# Patient Record
Sex: Male | Born: 1970 | Race: Black or African American | Hispanic: No | Marital: Single | Smoking: Never smoker
Health system: Southern US, Community
[De-identification: ages and names within clinical notes are randomized; demographics above are authoritative.]

---

## 1998-09-12 ENCOUNTER — Emergency Department (HOSPITAL_COMMUNITY): Admission: EM | Admit: 1998-09-12 | Discharge: 1998-09-12 | Payer: Self-pay | Admitting: Emergency Medicine

## 2010-12-31 ENCOUNTER — Other Ambulatory Visit: Payer: Self-pay | Admitting: Otolaryngology

## 2010-12-31 DIAGNOSIS — H9201 Otalgia, right ear: Secondary | ICD-10-CM

## 2011-01-09 ENCOUNTER — Ambulatory Visit
Admission: RE | Admit: 2011-01-09 | Discharge: 2011-01-09 | Disposition: A | Discharge status: Home / Self Care | Payer: Medicaid Other | Source: Ambulatory Visit | Attending: Otolaryngology | Admitting: Otolaryngology

## 2011-01-09 DIAGNOSIS — H9201 Otalgia, right ear: Secondary | ICD-10-CM

## 2011-01-09 MED ORDER — GADOBENATE DIMEGLUMINE 529 MG/ML IV SOLN
20.0000 mL | Freq: Once | INTRAVENOUS | Status: AC | PRN
  Administered 2011-01-09: 20 mL via INTRAVENOUS

## 2011-03-17 ENCOUNTER — Ambulatory Visit
Admission: RE | Admit: 2011-03-17 | Discharge: 2011-03-17 | Disposition: A | Discharge status: Home / Self Care | Payer: Medicaid Other | Source: Ambulatory Visit | Attending: Physician Assistant | Admitting: Physician Assistant

## 2011-03-17 ENCOUNTER — Other Ambulatory Visit: Payer: Self-pay | Admitting: Physician Assistant

## 2011-03-17 DIAGNOSIS — T1490XA Injury, unspecified, initial encounter: Secondary | ICD-10-CM

## 2011-03-25 ENCOUNTER — Ambulatory Visit: Payer: Medicaid Other | Attending: Orthopedic Surgery | Admitting: Occupational Therapy

## 2011-03-25 DIAGNOSIS — M25549 Pain in joints of unspecified hand: Secondary | ICD-10-CM | POA: Insufficient documentation

## 2011-03-25 DIAGNOSIS — M6281 Muscle weakness (generalized): Secondary | ICD-10-CM | POA: Insufficient documentation

## 2011-03-25 DIAGNOSIS — M25649 Stiffness of unspecified hand, not elsewhere classified: Secondary | ICD-10-CM | POA: Insufficient documentation

## 2011-03-25 DIAGNOSIS — IMO0001 Reserved for inherently not codable concepts without codable children: Secondary | ICD-10-CM | POA: Insufficient documentation

## 2011-03-31 ENCOUNTER — Encounter: Payer: Medicaid Other | Admitting: Occupational Therapy

## 2011-04-09 ENCOUNTER — Ambulatory Visit: Payer: Medicaid Other | Admitting: Occupational Therapy

## 2011-04-17 ENCOUNTER — Encounter: Payer: Medicaid Other | Admitting: Occupational Therapy

## 2011-05-04 ENCOUNTER — Ambulatory Visit: Payer: Medicaid Other | Attending: Orthopedic Surgery | Admitting: Occupational Therapy

## 2011-05-04 DIAGNOSIS — M6281 Muscle weakness (generalized): Secondary | ICD-10-CM | POA: Insufficient documentation

## 2011-05-04 DIAGNOSIS — M25649 Stiffness of unspecified hand, not elsewhere classified: Secondary | ICD-10-CM | POA: Insufficient documentation

## 2011-05-04 DIAGNOSIS — M25549 Pain in joints of unspecified hand: Secondary | ICD-10-CM | POA: Insufficient documentation

## 2011-05-04 DIAGNOSIS — IMO0001 Reserved for inherently not codable concepts without codable children: Secondary | ICD-10-CM | POA: Insufficient documentation

## 2011-07-13 ENCOUNTER — Ambulatory Visit: Payer: Medicaid Other | Attending: Orthopedic Surgery | Admitting: Occupational Therapy

## 2011-07-13 DIAGNOSIS — M25649 Stiffness of unspecified hand, not elsewhere classified: Secondary | ICD-10-CM | POA: Insufficient documentation

## 2011-07-13 DIAGNOSIS — M6281 Muscle weakness (generalized): Secondary | ICD-10-CM | POA: Insufficient documentation

## 2011-07-13 DIAGNOSIS — M25549 Pain in joints of unspecified hand: Secondary | ICD-10-CM | POA: Insufficient documentation

## 2011-07-13 DIAGNOSIS — IMO0001 Reserved for inherently not codable concepts without codable children: Secondary | ICD-10-CM | POA: Insufficient documentation

## 2011-07-28 ENCOUNTER — Encounter: Payer: Medicaid Other | Admitting: Occupational Therapy

## 2011-08-04 ENCOUNTER — Encounter: Payer: Medicaid Other | Admitting: Occupational Therapy

## 2011-08-11 ENCOUNTER — Encounter: Payer: Medicaid Other | Admitting: Occupational Therapy

## 2011-08-14 ENCOUNTER — Ambulatory Visit: Payer: Medicaid Other | Attending: Orthopedic Surgery | Admitting: Occupational Therapy

## 2011-08-14 DIAGNOSIS — M25549 Pain in joints of unspecified hand: Secondary | ICD-10-CM | POA: Insufficient documentation

## 2011-08-14 DIAGNOSIS — IMO0001 Reserved for inherently not codable concepts without codable children: Secondary | ICD-10-CM | POA: Insufficient documentation

## 2011-08-14 DIAGNOSIS — M25649 Stiffness of unspecified hand, not elsewhere classified: Secondary | ICD-10-CM | POA: Insufficient documentation

## 2011-08-18 ENCOUNTER — Ambulatory Visit: Payer: Medicaid Other | Admitting: Occupational Therapy

## 2011-09-01 ENCOUNTER — Encounter: Payer: Medicaid Other | Admitting: Occupational Therapy

## 2014-05-08 ENCOUNTER — Other Ambulatory Visit (INDEPENDENT_AMBULATORY_CARE_PROVIDER_SITE_OTHER): Payer: Self-pay | Admitting: Surgery

## 2015-08-15 ENCOUNTER — Other Ambulatory Visit: Payer: Self-pay | Admitting: Internal Medicine

## 2016-12-04 ENCOUNTER — Emergency Department (HOSPITAL_BASED_OUTPATIENT_CLINIC_OR_DEPARTMENT_OTHER): Payer: Worker's Compensation

## 2016-12-04 ENCOUNTER — Encounter (HOSPITAL_BASED_OUTPATIENT_CLINIC_OR_DEPARTMENT_OTHER): Payer: Self-pay | Admitting: *Deleted

## 2016-12-04 ENCOUNTER — Emergency Department (HOSPITAL_BASED_OUTPATIENT_CLINIC_OR_DEPARTMENT_OTHER)
Admission: EM | Admit: 2016-12-04 | Discharge: 2016-12-04 | Disposition: A | Discharge status: Home / Self Care | Payer: Worker's Compensation | Attending: Emergency Medicine | Admitting: Emergency Medicine

## 2016-12-04 DIAGNOSIS — Y929 Unspecified place or not applicable: Secondary | ICD-10-CM | POA: Diagnosis not present

## 2016-12-04 DIAGNOSIS — Y990 Civilian activity done for income or pay: Secondary | ICD-10-CM | POA: Insufficient documentation

## 2016-12-04 DIAGNOSIS — S81822A Laceration with foreign body, left lower leg, initial encounter: Secondary | ICD-10-CM | POA: Diagnosis not present

## 2016-12-04 DIAGNOSIS — S8992XA Unspecified injury of left lower leg, initial encounter: Secondary | ICD-10-CM | POA: Diagnosis present

## 2016-12-04 DIAGNOSIS — Y939 Activity, unspecified: Secondary | ICD-10-CM | POA: Insufficient documentation

## 2016-12-04 DIAGNOSIS — W458XXA Other foreign body or object entering through skin, initial encounter: Secondary | ICD-10-CM | POA: Diagnosis not present

## 2016-12-04 DIAGNOSIS — S80852A Superficial foreign body, left lower leg, initial encounter: Secondary | ICD-10-CM

## 2016-12-04 MED ORDER — AMOXICILLIN-POT CLAVULANATE 875-125 MG PO TABS: 1.0000 | ORAL_TABLET | Freq: Two times a day (BID) | ORAL | 0 refills | Status: AC

## 2016-12-04 MED ORDER — AMOXICILLIN-POT CLAVULANATE 875-125 MG PO TABS
1.0000 | ORAL_TABLET | Freq: Once | ORAL | Status: AC
  Administered 2016-12-04: 1 via ORAL
  Filled 2016-12-04: qty 1

## 2016-12-04 MED ORDER — LIDOCAINE-EPINEPHRINE (PF) 2 %-1:200000 IJ SOLN
10.0000 mL | Freq: Once | INTRAMUSCULAR | Status: AC
  Administered 2016-12-04: 10 mL
  Filled 2016-12-04: qty 10

## 2016-12-04 MED ORDER — IBUPROFEN 400 MG PO TABS
600.0000 mg | ORAL_TABLET | Freq: Once | ORAL | Status: AC
  Administered 2016-12-04: 600 mg via ORAL
  Filled 2016-12-04: qty 1

## 2016-12-04 NOTE — Discharge Instructions (Signed)
Please read instructions below. Keep your wound clean and covered. You can wash it in the shower with soap and water. Take the antibiotic, Augmentin, 2 times per day until gone. You can take 600 mg of Advil 6 hours as needed for pain. You can apply ice to your knee for 20 minutes at a time. Schedule an appointment with your primary care provider in 2-3 days for recheck of your wound. Return to the ER for pus draining from your wound, increased redness, fever, vomiting, or new or concerning symptoms.
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
## 2016-12-04 NOTE — ED Notes (Signed)
Pt did not complaint of pain at this time.  He just got Lidocaine with epi prior to arrival.

## 2016-12-04 NOTE — ED Notes (Signed)
ED Provider at bedside. 

## 2016-12-04 NOTE — ED Notes (Signed)
Pt refused wheelchair.  

## 2017-08-09 IMAGING — CR DG KNEE 1-2V*L*
2 series · 2 of 2 positions shown · non-contrast
Comparison: None.

CLINICAL DATA: Pain after puncture wound. Evaluate for foreign
body.

EXAM:
LEFT KNEE - 1-2 VIEW

[t knee ap left]
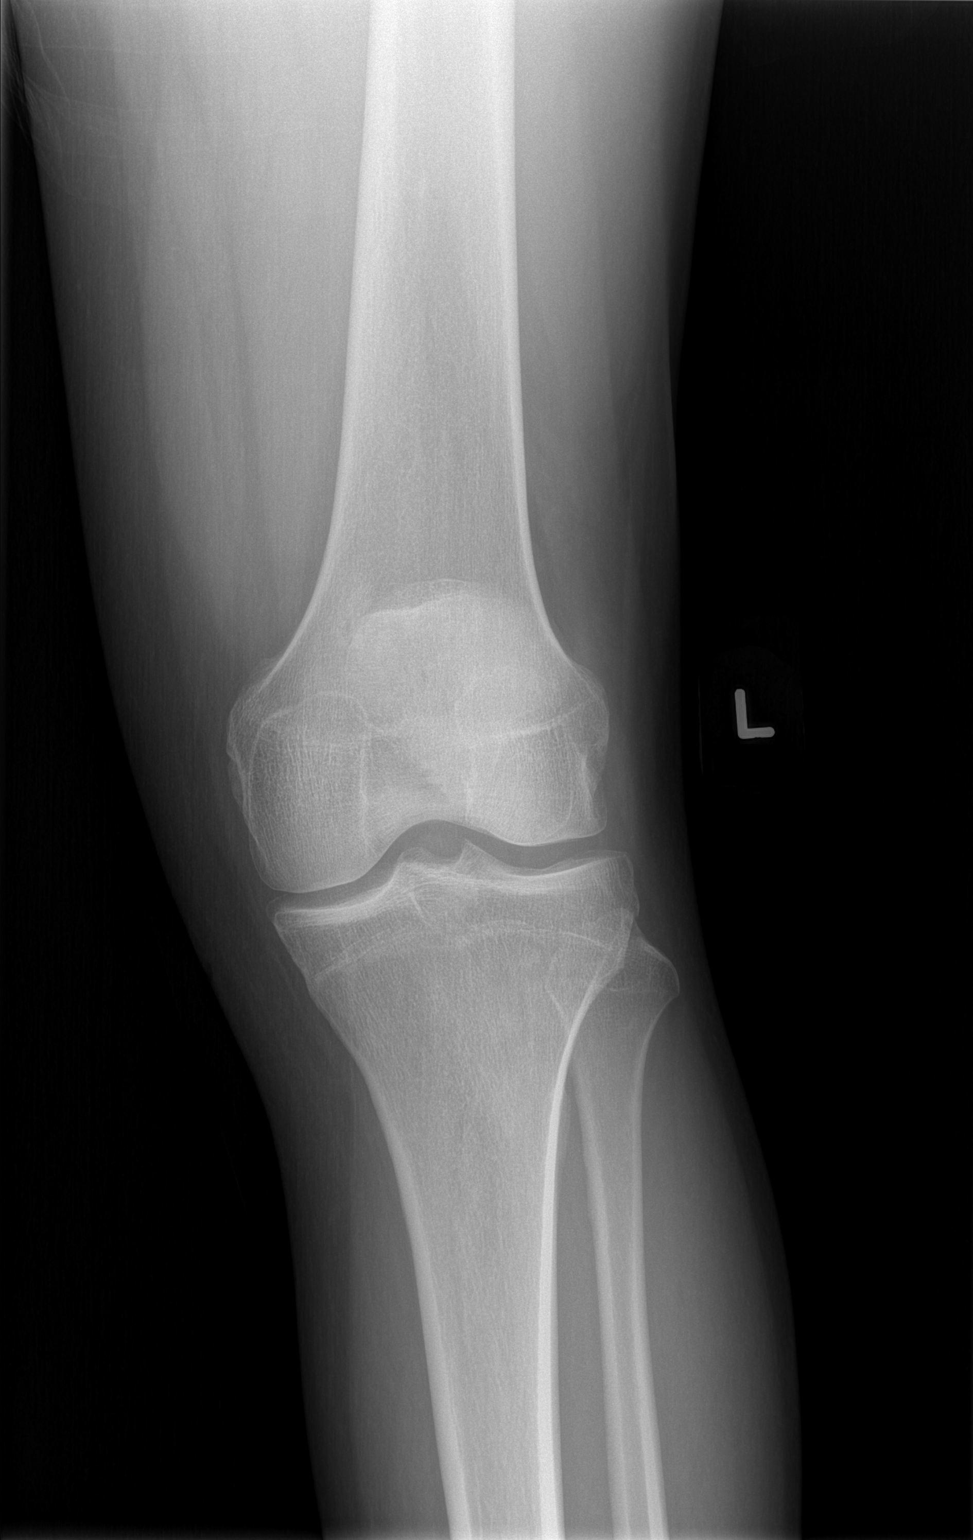

[t knee lat left]
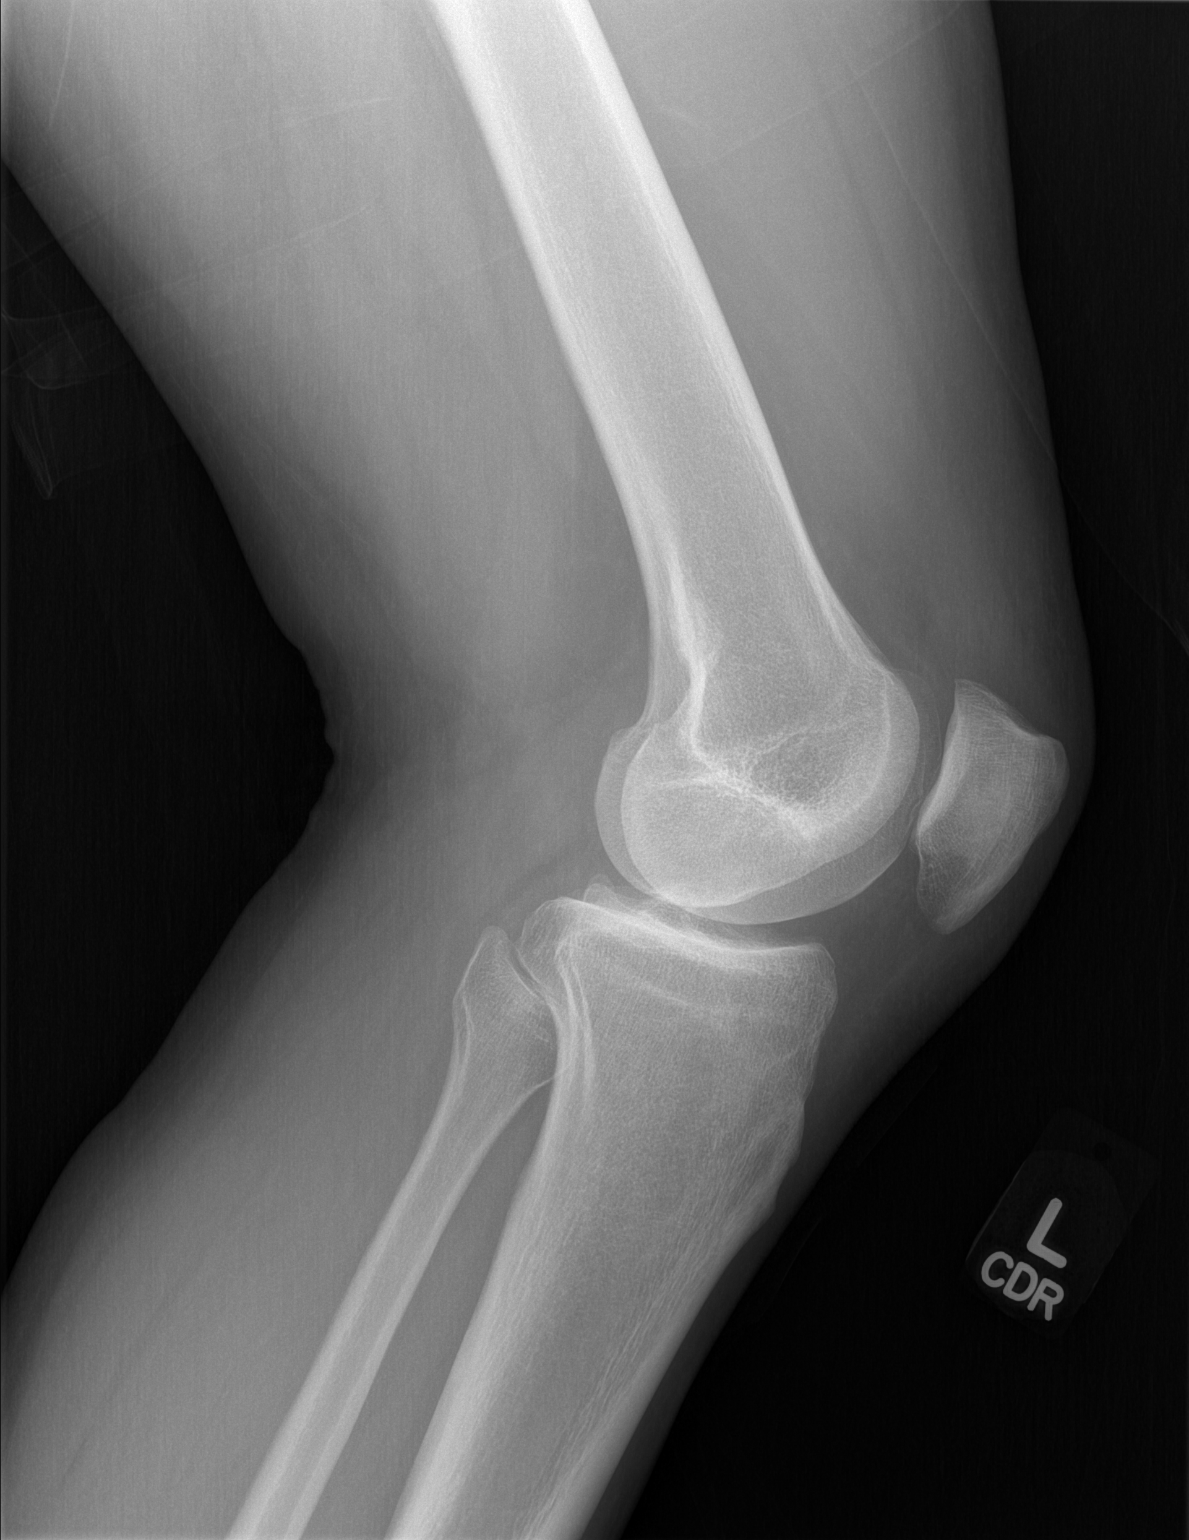

[2 of 2 positions shown; findings below may reference images not displayed]

FINDINGS: No evidence of fracture, dislocation, or joint effusion. No evidence
of arthropathy or other focal bone abnormality. Soft tissues are
unremarkable.
IMPRESSION: Negative.

## 2020-09-30 ENCOUNTER — Other Ambulatory Visit: Payer: Self-pay

## 2020-09-30 ENCOUNTER — Emergency Department (HOSPITAL_COMMUNITY)
Admission: EM | Admit: 2020-09-30 | Discharge: 2020-09-30 | Disposition: A | Discharge status: Home / Self Care | Payer: Self-pay | Attending: Emergency Medicine | Admitting: Emergency Medicine

## 2020-09-30 ENCOUNTER — Emergency Department (HOSPITAL_COMMUNITY): Payer: Self-pay

## 2020-09-30 ENCOUNTER — Encounter (HOSPITAL_COMMUNITY): Payer: Self-pay | Admitting: *Deleted

## 2020-09-30 DIAGNOSIS — K920 Hematemesis: Secondary | ICD-10-CM | POA: Insufficient documentation

## 2020-09-30 DIAGNOSIS — R0609 Other forms of dyspnea: Secondary | ICD-10-CM | POA: Insufficient documentation

## 2020-09-30 DIAGNOSIS — K922 Gastrointestinal hemorrhage, unspecified: Secondary | ICD-10-CM

## 2020-09-30 LAB — COMPREHENSIVE METABOLIC PANEL
ALT: 25 U/L (ref 0–44)
AST: 20 U/L (ref 15–41)
Albumin: 4.1 g/dL (ref 3.5–5.0)
Alkaline Phosphatase: 58 U/L (ref 38–126)
Anion gap: 8 (ref 5–15)
BUN: 16 mg/dL (ref 6–20)
CO2: 25 mmol/L (ref 22–32)
Calcium: 8.9 mg/dL (ref 8.9–10.3)
Chloride: 103 mmol/L (ref 98–111)
Creatinine, Ser: 1.23 mg/dL (ref 0.61–1.24)
GFR, Estimated: >60 mL/min (ref >60)
Glucose, Bld: 87 mg/dL (ref 70–99)
Potassium: 4.2 mmol/L (ref 3.5–5.1)
Sodium: 136 mmol/L (ref 135–145)
Total Bilirubin: 0.4 mg/dL (ref 0.3–1.2)
Total Protein: 7.7 g/dL (ref 6.5–8.1)

## 2020-09-30 LAB — CBC WITH DIFFERENTIAL/PLATELET
Abs Immature Granulocytes: 0.01 10*3/uL (ref 0.00–0.07)
Basophils Absolute: 0 10*3/uL (ref 0.0–0.1)
Basophils Relative: 0 %
Eosinophils Absolute: 0 10*3/uL (ref 0.0–0.5)
Eosinophils Relative: 1 %
HCT: 41.1 % (ref 39.0–52.0)
Hemoglobin: 13.6 g/dL (ref 13.0–17.0)
Immature Granulocytes: 0 %
Lymphocytes Relative: 34 %
Lymphs Abs: 2.1 10*3/uL (ref 0.7–4.0)
MCH: 30.8 pg (ref 26.0–34.0)
MCHC: 33.1 g/dL (ref 30.0–36.0)
MCV: 93 fL (ref 80.0–100.0)
Monocytes Absolute: 0.5 10*3/uL (ref 0.1–1.0)
Monocytes Relative: 9 %
Neutro Abs: 3.3 10*3/uL (ref 1.7–7.7)
Neutrophils Relative %: 56 %
Platelets: 296 10*3/uL (ref 150–400)
RBC: 4.42 MIL/uL (ref 4.22–5.81)
RDW: 12.5 % (ref 11.5–15.5)
WBC: 6 10*3/uL (ref 4.0–10.5)
nRBC: 0 % (ref 0.0–0.2)

## 2020-09-30 LAB — POC OCCULT BLOOD, ED: Fecal Occult Bld: NEGATIVE

## 2020-09-30 MED ORDER — PANTOPRAZOLE SODIUM 20 MG PO TBEC: 20.0000 mg | DELAYED_RELEASE_TABLET | Freq: Two times a day (BID) | ORAL | 0 refills | Status: AC

## 2020-09-30 MED ORDER — ALBUTEROL SULFATE HFA 108 (90 BASE) MCG/ACT IN AERS
2.0000 | INHALATION_SPRAY | RESPIRATORY_TRACT | Status: DC | PRN
  Administered 2020-09-30: 2 via RESPIRATORY_TRACT
  Filled 2020-09-30: qty 6.7

## 2020-09-30 MED ORDER — SUCRALFATE 1 G PO TABS: 1.0000 g | ORAL_TABLET | Freq: Four times a day (QID) | ORAL | 0 refills | Status: AC

## 2020-09-30 NOTE — ED Provider Notes (Signed)
[facility]-EMERGENCY DEPT Provider Note   CSN: [id number] Arrival date & time: 09/30/20  1943     History Chief Complaint  Patient presents with  . Hematemesis  . Shortness of Breath    Samuel Rich is a 50 y.o. male.  50 year old male presents with several weeks of hematemesis.  Denies any black or bloody stools.  States he has been coming more dyspneic with exertion.  Denies any chest pain or chest pressure.  States that he does have history of GERD.  Denies any use of alcohol or NSAIDs.  No recent fever or chills.  No abdominal discomfort.  No treatment use prior to arrival        History reviewed. No pertinent past medical history.  There are no problems to display for this patient.   History reviewed. No pertinent surgical history.     No family history on file.  Social History   Tobacco Use  . Smoking status: Never Smoker  . Smokeless tobacco: Never Used  Vaping Use  . Vaping Use: Never used  Substance Use Topics  . Alcohol use: No  . Drug use: No    Home Medications Prior to Admission medications   Not on File    Allergies    Patient has no known allergies.  Review of Systems   Review of Systems  All other systems reviewed and are negative.   Physical Exam Updated Vital Signs BP (!) 145/85 (BP Location: Left Arm)   Pulse 62   Temp 98.3 F (36.8 C) (Oral)   Resp 16   Ht 1.905 m (6\' 3" )   Wt 124.7 kg   SpO2 99%   BMI 34.37 kg/m   Physical Exam Vitals and nursing note reviewed.  Constitutional:      General: He is not in acute distress.    Appearance: Normal appearance. He is well-developed. He is not toxic-appearing.  HENT:     Head: Normocephalic and atraumatic.  Eyes:     General: Lids are normal.     Conjunctiva/sclera: Conjunctivae normal.     Pupils: Pupils are equal, round, and reactive to light.  Neck:     Thyroid: No thyroid mass.     Trachea: No tracheal deviation.  Cardiovascular:     Rate and  Rhythm: Normal rate and regular rhythm.     Heart sounds: Normal heart sounds. No murmur heard. No gallop.   Pulmonary:     Effort: Pulmonary effort is normal. No respiratory distress.     Breath sounds: Normal breath sounds. No stridor. No decreased breath sounds, wheezing, rhonchi or rales.  Abdominal:     General: Bowel sounds are normal. There is no distension.     Palpations: Abdomen is soft.     Tenderness: There is no abdominal tenderness. There is no rebound.  Musculoskeletal:        General: No tenderness. Normal range of motion.     Cervical back: Normal range of motion and neck supple.  Skin:    General: Skin is warm and dry.     Findings: No abrasion or rash.  Neurological:     Mental Status: He is alert and oriented to person, place, and time.     GCS: GCS eye subscore is 4. GCS verbal subscore is 5. GCS motor subscore is 6.     Cranial Nerves: No cranial nerve deficit.     Sensory: No sensory deficit.  Psychiatric:  Speech: Speech normal.        Behavior: Behavior normal.     ED Results / Procedures / Treatments   Labs (all labs ordered are listed, but only abnormal results are displayed) Labs Reviewed  CBC WITH DIFFERENTIAL/PLATELET  COMPREHENSIVE METABOLIC PANEL  POC OCCULT BLOOD, ED    EKG None  Radiology DG Chest 2 View  Result Date: 09/30/2020 CLINICAL DATA:  Shortness of breath and vomiting blood. EXAM: CHEST - 2 VIEW COMPARISON:  None. FINDINGS: The heart size and mediastinal contours are within normal limits. Both lungs are clear. The visualized skeletal structures are unremarkable. IMPRESSION: No active cardiopulmonary disease. Electronically Signed   By: Burman Nieves M.D.   On: 09/30/2020 20:35    Procedures Procedures   Medications Ordered in ED Medications  albuterol (VENTOLIN HFA) 108 (90 Base) MCG/ACT inhaler 2 puff (has no administration in time range)    ED Course  I have reviewed the triage vital signs and the nursing  notes.  Pertinent labs & imaging results that were available during my care of the patient were reviewed by me and considered in my medical decision making (see chart for details).    MDM Rules/Calculators/A&P                          Labs here are reassuring as well as chest x-ray.  He is guaiac negative from below.  Will be given referral to GI and placed on Carafate as well as PPI Final Clinical Impression(s) / ED Diagnoses Final diagnoses:  None    Rx / DC Orders ED Discharge Orders    None       Lorre Nick, MD 09/30/20 2144

## 2020-09-30 NOTE — ED Triage Notes (Signed)
Pt has been sob intermittently for 3-4 weeks and pt vomited blood a few times.

## 2020-09-30 NOTE — ED Triage Notes (Signed)
Symptoms x3-4 weeks

## 2020-09-30 NOTE — ED Triage Notes (Signed)
Emergency Medicine Provider Triage Evaluation Note  Samuel Rich , a 50 y.o. male  was evaluated in triage.  Pt complains of hematemesis.  He states that he has been dealing with severe GERD for many months.  He takes an OTC medication for his GERD but cannot remember the name.  He states that he will sometimes forget to take this medication and in the evening this will cause him to experience reflux symptoms.  He states that he will cough up previous dinner contents and sometimes retch.  He states that he will intermittently retch small amounts of blood when this occurs.  He states about 2 weeks ago this occurred and he had a much larger amount than normal which then again happened last night.  He states that he probably vomited up 1 cup of blood.  He also complains of intermittent shortness of breath for the past 3 to 4 weeks.  Worsens with exertion.  Denies any chest pain, abdominal pain, or sore throat.  No current nausea or vomiting.  No other complaints.  Physical Exam  BP (!) 145/85 (BP Location: Left Arm)   Pulse 62   Temp 98.3 F (36.8 C) (Oral)   Resp 16   Ht 6\' 3"  (1.905 m)   Wt 124.7 kg   SpO2 99%   BMI 34.37 kg/m  Gen:   Awake, no distress   HEENT:  Atraumatic  Resp:  Normal effort  Cardiac:  Normal rate  Abd:   Nondistended, nontender  MSK:   Moves extremities without difficulty  Neuro:  Speech clear   Medical Decision Making  Medically screening exam initiated at 8:36 PM.  Appropriate orders placed.  Mohd. Derflinger was informed that the remainder of the evaluation will be completed by another provider, this initial triage assessment does not replace that evaluation, and the importance of remaining in the ED until their evaluation is complete.   Leitha Bleak, PA-C 09/30/20 2037

## 2021-09-02 ENCOUNTER — Other Ambulatory Visit: Payer: Self-pay

## 2021-09-02 ENCOUNTER — Other Ambulatory Visit: Payer: Self-pay | Admitting: Physician Assistant

## 2021-09-02 ENCOUNTER — Ambulatory Visit (INDEPENDENT_AMBULATORY_CARE_PROVIDER_SITE_OTHER): Payer: Self-pay

## 2021-09-02 DIAGNOSIS — Z021 Encounter for pre-employment examination: Secondary | ICD-10-CM

## 2022-05-08 IMAGING — DX DG CHEST 1V
1 series · 1 of 1 positions shown · non-contrast
Comparison: Chest x-ray dated September 30, 2020

CLINICAL DATA: Pre-employment exam

EXAM:
CHEST  1 VIEW

[chest pa]
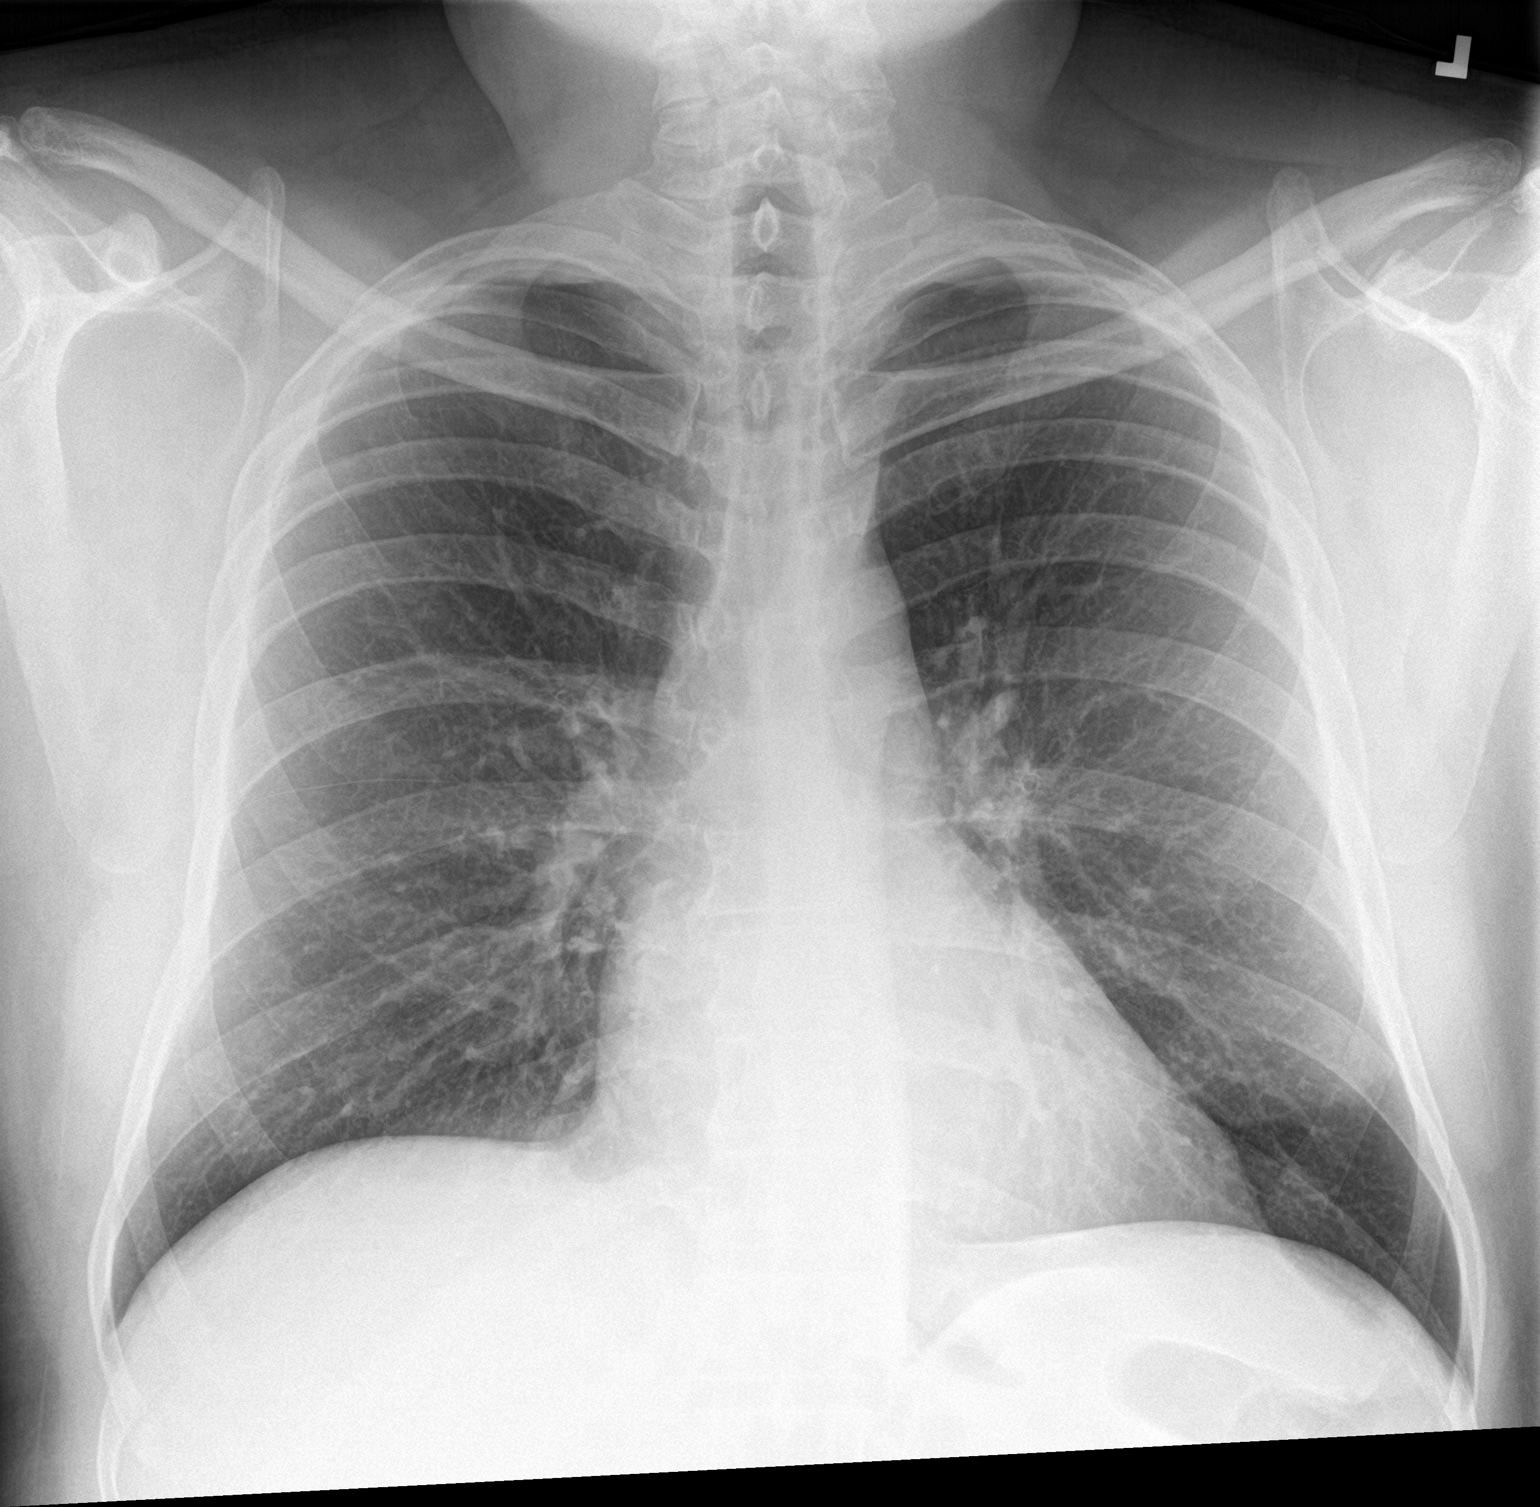

[1 of 1 positions shown; findings below may reference images not displayed]

FINDINGS: The heart size and mediastinal contours are within normal limits.
Both lungs are clear. The visualized skeletal structures are
unremarkable.
IMPRESSION: No active disease.
